# Patient Record
Sex: Female | Born: 1975 | Race: White | Hispanic: No | Marital: Married | Smoking: Never smoker
Health system: Southern US, Community
[De-identification: ages and names within clinical notes are randomized; demographics above are authoritative.]

## PROBLEM LIST (undated history)

## (undated) DIAGNOSIS — A64 Unspecified sexually transmitted disease: Secondary | ICD-10-CM

## (undated) DIAGNOSIS — R002 Palpitations: Secondary | ICD-10-CM

## (undated) DIAGNOSIS — B2790 Infectious mononucleosis, unspecified without complication: Secondary | ICD-10-CM

## (undated) DIAGNOSIS — R748 Abnormal levels of other serum enzymes: Secondary | ICD-10-CM

## (undated) DIAGNOSIS — N946 Dysmenorrhea, unspecified: Secondary | ICD-10-CM

## (undated) DIAGNOSIS — E039 Hypothyroidism, unspecified: Secondary | ICD-10-CM

## (undated) DIAGNOSIS — Q225 Ebstein's anomaly: Secondary | ICD-10-CM

## (undated) DIAGNOSIS — K219 Gastro-esophageal reflux disease without esophagitis: Secondary | ICD-10-CM

## (undated) HISTORY — DX: Palpitations: R00.2

## (undated) HISTORY — DX: Unspecified sexually transmitted disease: A64

## (undated) HISTORY — DX: Dysmenorrhea, unspecified: N94.6

## (undated) HISTORY — DX: Gastro-esophageal reflux disease without esophagitis: K21.9

## (undated) HISTORY — PX: EYE SURGERY: SHX253

## (undated) HISTORY — DX: Hypothyroidism, unspecified: E03.9

---

## 2005-03-05 ENCOUNTER — Other Ambulatory Visit: Admission: RE | Admit: 2005-03-05 | Discharge: 2005-03-05 | Payer: Self-pay | Admitting: Obstetrics and Gynecology

## 2005-09-15 ENCOUNTER — Other Ambulatory Visit: Admission: RE | Admit: 2005-09-15 | Discharge: 2005-09-15 | Payer: Self-pay | Admitting: Obstetrics and Gynecology

## 2008-12-26 ENCOUNTER — Inpatient Hospital Stay (HOSPITAL_COMMUNITY): Admission: AD | Admit: 2008-12-26 | Discharge: 2008-12-29 | Payer: Self-pay | Admitting: Obstetrics and Gynecology

## 2008-12-27 ENCOUNTER — Encounter (INDEPENDENT_AMBULATORY_CARE_PROVIDER_SITE_OTHER): Payer: Self-pay | Admitting: Obstetrics and Gynecology

## 2009-01-01 ENCOUNTER — Ambulatory Visit: Admission: RE | Admit: 2009-01-01 | Discharge: 2009-01-01 | Payer: Self-pay | Admitting: Obstetrics and Gynecology

## 2010-10-26 LAB — CBC
HCT: 44.2 % (ref 36.0–46.0)
MCHC: 35.6 g/dL (ref 30.0–36.0)
MCV: 97.2 fL (ref 78.0–100.0)
Platelets: 280 10*3/uL (ref 150–400)
RDW: 14.5 % (ref 11.5–15.5)
RDW: 14.9 % (ref 11.5–15.5)

## 2010-10-29 ENCOUNTER — Other Ambulatory Visit: Payer: Self-pay | Admitting: Obstetrics and Gynecology

## 2013-05-07 ENCOUNTER — Encounter: Payer: Self-pay | Admitting: Obstetrics and Gynecology

## 2013-05-28 ENCOUNTER — Encounter: Payer: Self-pay | Admitting: Obstetrics and Gynecology

## 2013-09-17 ENCOUNTER — Other Ambulatory Visit: Payer: Self-pay | Admitting: Obstetrics and Gynecology

## 2013-09-17 ENCOUNTER — Ambulatory Visit (INDEPENDENT_AMBULATORY_CARE_PROVIDER_SITE_OTHER): Payer: BC Managed Care – PPO | Admitting: Obstetrics and Gynecology

## 2013-09-17 ENCOUNTER — Encounter: Payer: Self-pay | Admitting: Obstetrics and Gynecology

## 2013-09-17 VITALS — BP 100/70 | HR 64 | Ht 63.0 in | Wt 155.0 lb

## 2013-09-17 DIAGNOSIS — N920 Excessive and frequent menstruation with regular cycle: Secondary | ICD-10-CM

## 2013-09-17 DIAGNOSIS — Z0000 Encounter for general adult medical examination without abnormal findings: Secondary | ICD-10-CM

## 2013-09-17 DIAGNOSIS — Z01419 Encounter for gynecological examination (general) (routine) without abnormal findings: Secondary | ICD-10-CM

## 2013-09-17 DIAGNOSIS — Z1231 Encounter for screening mammogram for malignant neoplasm of breast: Secondary | ICD-10-CM

## 2013-09-17 DIAGNOSIS — R5383 Other fatigue: Secondary | ICD-10-CM

## 2013-09-17 DIAGNOSIS — N946 Dysmenorrhea, unspecified: Secondary | ICD-10-CM

## 2013-09-17 DIAGNOSIS — R5381 Other malaise: Secondary | ICD-10-CM

## 2013-09-17 LAB — COMPREHENSIVE METABOLIC PANEL
ALBUMIN: 4.4 g/dL (ref 3.5–5.2)
ALT: 22 U/L (ref 0–35)
AST: 21 U/L (ref 0–37)
Alkaline Phosphatase: 58 U/L (ref 39–117)
BUN: 11 mg/dL (ref 6–23)
CALCIUM: 9.7 mg/dL (ref 8.4–10.5)
CHLORIDE: 107 meq/L (ref 96–112)
CO2: 25 meq/L (ref 19–32)
CREATININE: 0.78 mg/dL (ref 0.50–1.10)
Glucose, Bld: 85 mg/dL (ref 70–99)
POTASSIUM: 4.2 meq/L (ref 3.5–5.3)
Sodium: 141 mEq/L (ref 135–145)
Total Bilirubin: 0.7 mg/dL (ref 0.2–1.2)
Total Protein: 6.8 g/dL (ref 6.0–8.3)

## 2013-09-17 LAB — CBC
HEMATOCRIT: 40.4 % (ref 36.0–46.0)
HEMOGLOBIN: 14.3 g/dL (ref 12.0–15.0)
MCH: 31.5 pg (ref 26.0–34.0)
MCHC: 35.4 g/dL (ref 30.0–36.0)
MCV: 89 fL (ref 78.0–100.0)
Platelets: 348 10*3/uL (ref 150–400)
RBC: 4.54 MIL/uL (ref 3.87–5.11)
RDW: 13.6 % (ref 11.5–15.5)
WBC: 4.9 10*3/uL (ref 4.0–10.5)

## 2013-09-17 LAB — POCT URINALYSIS DIPSTICK
BILIRUBIN UA: NEGATIVE
Clarity, UA: NEGATIVE
GLUCOSE UA: NEGATIVE
KETONES UA: NEGATIVE
Leukocytes, UA: NEGATIVE
Nitrite, UA: NEGATIVE
PH UA: 5
Protein, UA: NEGATIVE
Urobilinogen, UA: NEGATIVE

## 2013-09-17 LAB — LIPID PANEL
CHOL/HDL RATIO: 3.5 ratio
CHOLESTEROL: 191 mg/dL (ref 0–200)
HDL: 55 mg/dL (ref >39)
LDL Cholesterol: 105 mg/dL — ABNORMAL HIGH (ref 0–99)
Triglycerides: 153 mg/dL — ABNORMAL HIGH (ref <150)
VLDL: 31 mg/dL (ref 0–40)

## 2013-09-17 LAB — HEMOGLOBIN, FINGERSTICK: Hemoglobin, fingerstick: 14.4 g/dL (ref 12.0–16.0)

## 2013-09-17 LAB — TSH: TSH: 2.76 u[IU]/mL (ref 0.350–4.500)

## 2013-09-17 NOTE — Progress Notes (Signed)
Patient scheduled for screening Mammogram at [facility] imaging for 10/08/13 at 1020. Patient agreeable to time/date/location.
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
## 2013-09-17 NOTE — Patient Instructions (Signed)

## 2013-09-18 LAB — IPS PAP TEST WITH HPV

## 2013-09-19 ENCOUNTER — Telehealth: Payer: Self-pay | Admitting: Obstetrics and Gynecology

## 2013-09-19 NOTE — Telephone Encounter (Signed)
° °  Left message for patient to call back. Need to go over insurance quote and schedule PUS.  ° ° °

## 2013-09-28 ENCOUNTER — Telehealth: Payer: Self-pay | Admitting: Obstetrics and Gynecology

## 2013-09-28 NOTE — Telephone Encounter (Signed)
Patient wants to hold off on having PUS performed. She will call back when she is ready.

## 2013-09-28 NOTE — Telephone Encounter (Signed)
Advised patient of PR: $408.26 for PUS.

## 2013-09-28 NOTE — Telephone Encounter (Signed)
OK. I will close encounter.

## 2013-10-08 ENCOUNTER — Ambulatory Visit
Admission: RE | Admit: 2013-10-08 | Discharge: 2013-10-08 | Disposition: A | Discharge status: Home / Self Care | Payer: Self-pay | Source: Ambulatory Visit | Attending: Obstetrics and Gynecology | Admitting: Obstetrics and Gynecology

## 2013-10-08 DIAGNOSIS — Z1231 Encounter for screening mammogram for malignant neoplasm of breast: Secondary | ICD-10-CM

## 2013-10-11 ENCOUNTER — Other Ambulatory Visit: Payer: Self-pay | Admitting: Obstetrics and Gynecology

## 2013-10-11 DIAGNOSIS — R928 Other abnormal and inconclusive findings on diagnostic imaging of breast: Secondary | ICD-10-CM

## 2013-10-22 ENCOUNTER — Ambulatory Visit
Admission: RE | Admit: 2013-10-22 | Discharge: 2013-10-22 | Disposition: A | Discharge status: Home / Self Care | Payer: Self-pay | Source: Ambulatory Visit | Attending: Obstetrics and Gynecology | Admitting: Obstetrics and Gynecology

## 2013-10-22 DIAGNOSIS — R928 Other abnormal and inconclusive findings on diagnostic imaging of breast: Secondary | ICD-10-CM

## 2014-05-09 ENCOUNTER — Telehealth: Payer: Self-pay | Admitting: Obstetrics and Gynecology

## 2014-05-09 NOTE — Telephone Encounter (Signed)
Left message upcoming appointment has been canceled and needs to be rescheduled. °

## 2014-05-20 ENCOUNTER — Encounter: Payer: Self-pay | Admitting: Obstetrics and Gynecology

## 2014-09-20 ENCOUNTER — Ambulatory Visit: Payer: BC Managed Care – PPO | Admitting: Obstetrics and Gynecology

## 2014-10-07 ENCOUNTER — Ambulatory Visit: Payer: Self-pay | Admitting: Obstetrics and Gynecology

## 2015-08-20 ENCOUNTER — Ambulatory Visit (INDEPENDENT_AMBULATORY_CARE_PROVIDER_SITE_OTHER): Payer: BLUE CROSS/BLUE SHIELD | Admitting: Obstetrics and Gynecology

## 2015-08-20 ENCOUNTER — Encounter: Payer: Self-pay | Admitting: Obstetrics and Gynecology

## 2015-08-20 VITALS — BP 104/76 | HR 72 | Resp 16 | Ht 63.0 in | Wt 148.0 lb

## 2015-08-20 DIAGNOSIS — Z0000 Encounter for general adult medical examination without abnormal findings: Secondary | ICD-10-CM

## 2015-08-20 DIAGNOSIS — Z01419 Encounter for gynecological examination (general) (routine) without abnormal findings: Secondary | ICD-10-CM

## 2015-08-20 DIAGNOSIS — Z23 Encounter for immunization: Secondary | ICD-10-CM

## 2015-08-20 LAB — POCT URINALYSIS DIPSTICK
BILIRUBIN UA: NEGATIVE
GLUCOSE UA: NEGATIVE
Ketones, UA: NEGATIVE
Leukocytes, UA: NEGATIVE
Nitrite, UA: NEGATIVE
Protein, UA: NEGATIVE
RBC UA: NEGATIVE
Urobilinogen, UA: NEGATIVE
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
## 2015-08-20 NOTE — Patient Instructions (Signed)

## 2015-08-22 LAB — IPS PAP TEST WITH HPV
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
## 2016-09-08 ENCOUNTER — Other Ambulatory Visit: Payer: Self-pay | Admitting: Obstetrics and Gynecology

## 2016-09-08 ENCOUNTER — Encounter: Payer: Self-pay | Admitting: Obstetrics and Gynecology

## 2016-09-08 ENCOUNTER — Ambulatory Visit (INDEPENDENT_AMBULATORY_CARE_PROVIDER_SITE_OTHER): Payer: BLUE CROSS/BLUE SHIELD | Admitting: Obstetrics and Gynecology

## 2016-09-08 VITALS — BP 110/70 | HR 80 | Resp 14 | Ht 63.25 in | Wt 157.0 lb

## 2016-09-08 DIAGNOSIS — Z01419 Encounter for gynecological examination (general) (routine) without abnormal findings: Secondary | ICD-10-CM | POA: Diagnosis not present

## 2016-09-08 DIAGNOSIS — Z1231 Encounter for screening mammogram for malignant neoplasm of breast: Secondary | ICD-10-CM

## 2016-09-08 MED ORDER — VALACYCLOVIR HCL 500 MG PO TABS: 500.0000 mg | ORAL_TABLET | Freq: Every day | ORAL | 3 refills | Status: DC

## 2016-09-08 NOTE — Patient Instructions (Signed)

## 2016-09-13 ENCOUNTER — Ambulatory Visit
Admission: RE | Admit: 2016-09-13 | Discharge: 2016-09-13 | Disposition: A | Discharge status: Home / Self Care | Payer: BLUE CROSS/BLUE SHIELD | Source: Ambulatory Visit | Attending: Obstetrics and Gynecology | Admitting: Obstetrics and Gynecology

## 2016-09-13 DIAGNOSIS — Z1231 Encounter for screening mammogram for malignant neoplasm of breast: Secondary | ICD-10-CM

## 2016-10-27 ENCOUNTER — Encounter (HOSPITAL_BASED_OUTPATIENT_CLINIC_OR_DEPARTMENT_OTHER): Payer: Self-pay | Admitting: Emergency Medicine

## 2016-10-27 ENCOUNTER — Emergency Department (HOSPITAL_BASED_OUTPATIENT_CLINIC_OR_DEPARTMENT_OTHER)
Admission: EM | Admit: 2016-10-27 | Discharge: 2016-10-27 | Disposition: A | Discharge status: Home / Self Care | Payer: BLUE CROSS/BLUE SHIELD | Attending: Emergency Medicine | Admitting: Emergency Medicine

## 2016-10-27 DIAGNOSIS — E039 Hypothyroidism, unspecified: Secondary | ICD-10-CM | POA: Diagnosis not present

## 2016-10-27 DIAGNOSIS — K2970 Gastritis, unspecified, without bleeding: Secondary | ICD-10-CM | POA: Diagnosis not present

## 2016-10-27 DIAGNOSIS — R1011 Right upper quadrant pain: Secondary | ICD-10-CM | POA: Diagnosis present

## 2016-10-27 HISTORY — DX: Infectious mononucleosis, unspecified without complication: B27.90

## 2016-10-27 LAB — CBC WITH DIFFERENTIAL/PLATELET
BASOS ABS: 0 10*3/uL (ref 0.0–0.1)
BASOS PCT: 0 %
Eosinophils Absolute: 0.1 10*3/uL (ref 0.0–0.7)
Eosinophils Relative: 1 %
HCT: 41.1 % (ref 36.0–46.0)
HEMOGLOBIN: 14.6 g/dL (ref 12.0–15.0)
LYMPHS ABS: 5 10*3/uL — AB (ref 0.7–4.0)
Lymphocytes Relative: 53 %
MCH: 31.8 pg (ref 26.0–34.0)
MCHC: 35.5 g/dL (ref 30.0–36.0)
MCV: 89.5 fL (ref 78.0–100.0)
Monocytes Absolute: 0.9 10*3/uL (ref 0.1–1.0)
Monocytes Relative: 10 %
NEUTROS PCT: 36 %
Neutro Abs: 3.4 10*3/uL (ref 1.7–7.7)
Platelets: 376 10*3/uL (ref 150–400)
RBC: 4.59 MIL/uL (ref 3.87–5.11)
RDW: 12.7 % (ref 11.5–15.5)
WBC: 9.4 10*3/uL (ref 4.0–10.5)

## 2016-10-27 LAB — COMPREHENSIVE METABOLIC PANEL
ALBUMIN: 4.6 g/dL (ref 3.5–5.0)
ALK PHOS: 74 U/L (ref 38–126)
ALT: 41 U/L (ref 14–54)
AST: 39 U/L (ref 15–41)
Anion gap: 10 (ref 5–15)
BUN: 11 mg/dL (ref 6–20)
CHLORIDE: 103 mmol/L (ref 101–111)
CO2: 21 mmol/L — AB (ref 22–32)
CREATININE: 0.8 mg/dL (ref 0.44–1.00)
Calcium: 9.3 mg/dL (ref 8.9–10.3)
GFR calc Af Amer: >60 mL/min (ref >60)
GFR calc non Af Amer: >60 mL/min (ref >60)
GLUCOSE: 110 mg/dL — AB (ref 65–99)
Potassium: 3 mmol/L — ABNORMAL LOW (ref 3.5–5.1)
SODIUM: 134 mmol/L — AB (ref 135–145)
Total Bilirubin: 0.7 mg/dL (ref 0.3–1.2)
Total Protein: 8.2 g/dL — ABNORMAL HIGH (ref 6.5–8.1)

## 2016-10-27 LAB — TROPONIN I

## 2016-10-27 LAB — LIPASE, BLOOD: LIPASE: 35 U/L (ref 11–51)

## 2016-10-27 LAB — PREGNANCY, URINE: Preg Test, Ur: NEGATIVE

## 2016-10-27 MED ORDER — POTASSIUM CHLORIDE CRYS ER 20 MEQ PO TBCR
40.0000 meq | EXTENDED_RELEASE_TABLET | Freq: Once | ORAL | Status: AC
  Administered 2016-10-27: 40 meq via ORAL
  Filled 2016-10-27: qty 2

## 2016-10-27 MED ORDER — ONDANSETRON HCL 4 MG/2ML IJ SOLN
4.0000 mg | Freq: Once | INTRAMUSCULAR | Status: AC
  Administered 2016-10-27: 4 mg via INTRAVENOUS
  Filled 2016-10-27: qty 2

## 2016-10-27 MED ORDER — GI COCKTAIL ~~LOC~~
30.0000 mL | Freq: Once | ORAL | Status: AC
  Administered 2016-10-27: 30 mL via ORAL
  Filled 2016-10-27: qty 30

## 2016-10-27 MED ORDER — PANTOPRAZOLE SODIUM 20 MG PO TBEC: 20.0000 mg | DELAYED_RELEASE_TABLET | Freq: Every day | ORAL | 0 refills | Status: DC

## 2016-10-27 NOTE — ED Triage Notes (Signed)
Pt has been having upper abd pain radiating to back x several days. Pt had [place] this morning looking for gallstones. Pt has not received results from this [place]. Pt given additional warm blankets.

## 2016-10-27 NOTE — Discharge Instructions (Signed)
Return to the ED with any concerns including vomiting and not able to keep down liquids or your medications, abdominal pain especially if it localizes to the right lower abdomen, fever or chills, and decreased urine output, decreased level of alertness or lethargy, or any other alarming symptoms.  °

## 2016-10-27 NOTE — ED Notes (Signed)
ED Provider at bedside. 
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
## 2016-10-27 NOTE — ED Triage Notes (Signed)
Sitting at desk on computer and felt dizzy and felt like she was going to pass out, h/a with nausea. Enroute to ED felt like her heart was fluttering. Tearful, states feels "scared that is going to die"

## 2016-11-09 ENCOUNTER — Encounter: Payer: Self-pay | Admitting: Obstetrics and Gynecology

## 2016-11-09 ENCOUNTER — Ambulatory Visit (INDEPENDENT_AMBULATORY_CARE_PROVIDER_SITE_OTHER): Payer: BLUE CROSS/BLUE SHIELD

## 2016-11-09 ENCOUNTER — Ambulatory Visit (INDEPENDENT_AMBULATORY_CARE_PROVIDER_SITE_OTHER): Payer: BLUE CROSS/BLUE SHIELD | Admitting: Family Medicine

## 2016-11-09 ENCOUNTER — Telehealth: Payer: Self-pay | Admitting: General Practice

## 2016-11-09 ENCOUNTER — Telehealth: Payer: Self-pay | Admitting: Obstetrics and Gynecology

## 2016-11-09 ENCOUNTER — Ambulatory Visit (INDEPENDENT_AMBULATORY_CARE_PROVIDER_SITE_OTHER): Payer: BLUE CROSS/BLUE SHIELD | Admitting: Obstetrics and Gynecology

## 2016-11-09 ENCOUNTER — Telehealth: Payer: Self-pay | Admitting: Family Medicine

## 2016-11-09 ENCOUNTER — Encounter: Payer: Self-pay | Admitting: Family Medicine

## 2016-11-09 VITALS — BP 108/70 | HR 73 | Temp 98.0°F | Ht 63.25 in | Wt 156.2 lb

## 2016-11-09 VITALS — BP 100/60 | HR 64 | Temp 98.9°F | Resp 14 | Wt 155.0 lb

## 2016-11-09 DIAGNOSIS — R1011 Right upper quadrant pain: Secondary | ICD-10-CM

## 2016-11-09 DIAGNOSIS — E038 Other specified hypothyroidism: Secondary | ICD-10-CM | POA: Diagnosis not present

## 2016-11-09 DIAGNOSIS — K5900 Constipation, unspecified: Secondary | ICD-10-CM

## 2016-11-09 DIAGNOSIS — R109 Unspecified abdominal pain: Secondary | ICD-10-CM | POA: Diagnosis not present

## 2016-11-09 DIAGNOSIS — R102 Pelvic and perineal pain: Secondary | ICD-10-CM

## 2016-11-09 DIAGNOSIS — R140 Abdominal distension (gaseous): Secondary | ICD-10-CM | POA: Diagnosis not present

## 2016-11-09 LAB — CBC WITH DIFFERENTIAL/PLATELET
BASOS PCT: 0 %
Basophils Absolute: 0 cells/uL (ref 0–200)
Eosinophils Absolute: 58 cells/uL (ref 15–500)
Eosinophils Relative: 1 %
HEMATOCRIT: 41.2 % (ref 35.0–45.0)
HEMOGLOBIN: 14.1 g/dL (ref 11.7–15.5)
LYMPHS ABS: 2552 {cells}/uL (ref 850–3900)
Lymphocytes Relative: 44 %
MCH: 31.7 pg (ref 27.0–33.0)
MCHC: 34.2 g/dL (ref 32.0–36.0)
MCV: 92.6 fL (ref 80.0–100.0)
MONO ABS: 696 {cells}/uL (ref 200–950)
MPV: 10.3 fL (ref 7.5–12.5)
Monocytes Relative: 12 %
NEUTROS ABS: 2494 {cells}/uL (ref 1500–7800)
Neutrophils Relative %: 43 %
Platelets: 306 10*3/uL (ref 140–400)
RBC: 4.45 MIL/uL (ref 3.80–5.10)
RDW: 13.7 % (ref 11.0–15.0)
WBC: 5.8 10*3/uL (ref 3.8–10.8)

## 2016-11-09 LAB — POCT URINALYSIS DIPSTICK
Bilirubin, UA: NEGATIVE
Blood, UA: NEGATIVE
GLUCOSE UA: NEGATIVE
Ketones, UA: NEGATIVE
Leukocytes, UA: NEGATIVE
NITRITE UA: NEGATIVE
Protein, UA: NEGATIVE
UROBILINOGEN UA: NEGATIVE U/dL — AB
pH, UA: 6 (ref 5.0–8.0)

## 2016-11-09 LAB — POCT URINE PREGNANCY: Preg Test, Ur: NEGATIVE

## 2016-11-09 MED ORDER — PSYLLIUM 0.52 G PO CAPS: 0.5200 g | ORAL_CAPSULE | Freq: Every day | ORAL | 0 refills | Status: DC

## 2016-11-09 MED ORDER — ALIGN PO CAPS: 1.0000 | ORAL_CAPSULE | Freq: Every day | ORAL | 0 refills | Status: DC

## 2016-11-09 MED ORDER — NAPROXEN SODIUM 550 MG PO TABS: 550.0000 mg | ORAL_TABLET | Freq: Two times a day (BID) | ORAL | 2 refills | Status: DC
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
## 2016-11-09 NOTE — Telephone Encounter (Signed)
Note made in error. See previous.

## 2016-11-09 NOTE — Addendum Note (Signed)
Addended by: Lorri Frederick on: 11/09/2016 03:38 PM   Modules accepted: Orders

## 2016-11-09 NOTE — Telephone Encounter (Signed)
Patient is coming in for appointment today.

## 2016-11-09 NOTE — Telephone Encounter (Signed)
Please Advise

## 2016-11-09 NOTE — Telephone Encounter (Signed)
Patient having right sided pelvic pain.

## 2016-11-09 NOTE — Telephone Encounter (Signed)
[facility] called on patients behalf to get visit scheduled. Stated patient was having abdominal pain, no GYN cause. Scheduled for Monday as new patient, but nurse I spoke with stated they would like her in sooner.
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
## 2016-11-09 NOTE — Telephone Encounter (Signed)
Spoke with patient. Patient states almost 2 weeks ago she began having right sided abdominal pain. Was seen at the ER on 10/27/2016. [place] to check for gallstones was negative. Suspected GERD was discharged home with precautions. Was seen by her PCP at [facility] healthcare (notes in Care Everywhere) BMP and H Pylori testing was done which was negative. CT scan performed on 11/01/2016 was normal. Patient has been referred to GI and advised to seek care with GYN for evaluation of ovaries and pelvis. Patient woke up this morning and pain is worsening in RLQ and pelvis. Last took Advil last night. Reports bloating. Denies fever, chills, or bleeding. Appointment scheduled for today 11/09/2016 at 11:30 am with Dr.Jertson as Dr.Silva is out of the office today. Patient is agreeable.  Cc: Dr.Silva  Routing to covering provider for final review. Patient agreeable to disposition. Will close encounter.

## 2016-11-10 DIAGNOSIS — R1011 Right upper quadrant pain: Secondary | ICD-10-CM | POA: Insufficient documentation

## 2016-11-10 DIAGNOSIS — K5900 Constipation, unspecified: Secondary | ICD-10-CM | POA: Insufficient documentation

## 2016-11-10 LAB — THYROID PANEL WITH TSH
FREE THYROXINE INDEX: 2.6 (ref 1.4–3.8)
T3 UPTAKE: 30 % (ref 22–35)
T4, Total: 8.8 ug/dL (ref 4.5–12.0)
TSH: 4.07 mIU/L

## 2016-11-10 LAB — GC/CHLAMYDIA PROBE AMP
CT Probe RNA: NOT DETECTED
GC Probe RNA: NOT DETECTED

## 2016-11-11 ENCOUNTER — Other Ambulatory Visit: Payer: Self-pay | Admitting: Gastroenterology

## 2016-11-11 DIAGNOSIS — R1011 Right upper quadrant pain: Secondary | ICD-10-CM

## 2016-11-11 DIAGNOSIS — R110 Nausea: Secondary | ICD-10-CM

## 2016-11-12 ENCOUNTER — Telehealth: Payer: Self-pay | Admitting: Family Medicine

## 2016-11-12 NOTE — Telephone Encounter (Signed)
Spoke with patient and she has seen GI this week. They are going to do a Hida Scan on her next week. [facility] called that patient, but she did not get the call. Patient is going to call them back to reschedule.

## 2016-11-12 NOTE — Telephone Encounter (Signed)
Patient called in reference to missing her appt. at [facility]. Said she did not receive a phone call or reminder. Patient also stated she saw GI doc yesterday 11/11/16 (Dr. Loreta Ave) at [facility] and they did blood work. Wanted to make sure we got those records over here for her. Please call and advise.

## 2016-11-15 ENCOUNTER — Ambulatory Visit: Payer: BLUE CROSS/BLUE SHIELD | Admitting: Family Medicine

## 2016-11-16 ENCOUNTER — Ambulatory Visit (HOSPITAL_COMMUNITY)
Admission: RE | Admit: 2016-11-16 | Discharge: 2016-11-16 | Disposition: A | Discharge status: Home / Self Care | Payer: BLUE CROSS/BLUE SHIELD | Source: Ambulatory Visit | Attending: Gastroenterology | Admitting: Gastroenterology

## 2016-11-16 DIAGNOSIS — R1011 Right upper quadrant pain: Secondary | ICD-10-CM | POA: Insufficient documentation

## 2016-11-16 DIAGNOSIS — R110 Nausea: Secondary | ICD-10-CM

## 2016-11-16 MED ORDER — TECHNETIUM TC 99M MEBROFENIN IV KIT
5.0000 | PACK | Freq: Once | INTRAVENOUS | Status: AC | PRN
  Administered 2016-11-16: 5 via INTRAVENOUS

## 2016-11-17 ENCOUNTER — Telehealth: Payer: Self-pay | Admitting: Obstetrics and Gynecology

## 2016-11-17 NOTE — Telephone Encounter (Signed)
Phone call in follow up the patient's visit here and abdominal pain.  I left a message that I was calling to check up on her and see if she needed anything further.

## 2016-11-25 ENCOUNTER — Telehealth: Payer: Self-pay | Admitting: Family Medicine

## 2016-11-25 NOTE — Telephone Encounter (Signed)
Faxing ROI to Dr.Suzann Hedgecock @ [phone] P[place]

## 2016-12-23 ENCOUNTER — Telehealth: Payer: Self-pay | Admitting: Family Medicine

## 2016-12-23 NOTE — Telephone Encounter (Signed)
Re-fax ROI to [facility]

## 2017-06-13 NOTE — Telephone Encounter (Signed)
No [facility] from [facility]

## 2017-07-19 DIAGNOSIS — K219 Gastro-esophageal reflux disease without esophagitis: Secondary | ICD-10-CM

## 2017-07-19 DIAGNOSIS — R002 Palpitations: Secondary | ICD-10-CM

## 2017-07-19 HISTORY — DX: Gastro-esophageal reflux disease without esophagitis: K21.9

## 2017-07-19 HISTORY — DX: Palpitations: R00.2

## 2017-09-21 ENCOUNTER — Ambulatory Visit: Payer: BLUE CROSS/BLUE SHIELD | Admitting: Obstetrics and Gynecology

## 2017-09-21 ENCOUNTER — Emergency Department (HOSPITAL_BASED_OUTPATIENT_CLINIC_OR_DEPARTMENT_OTHER)
Admission: EM | Admit: 2017-09-21 | Discharge: 2017-09-21 | Disposition: A | Discharge status: Home / Self Care | Payer: BLUE CROSS/BLUE SHIELD | Attending: Emergency Medicine | Admitting: Emergency Medicine

## 2017-09-21 ENCOUNTER — Other Ambulatory Visit: Payer: Self-pay

## 2017-09-21 ENCOUNTER — Encounter (HOSPITAL_BASED_OUTPATIENT_CLINIC_OR_DEPARTMENT_OTHER): Payer: Self-pay | Admitting: *Deleted

## 2017-09-21 DIAGNOSIS — R110 Nausea: Secondary | ICD-10-CM | POA: Diagnosis not present

## 2017-09-21 DIAGNOSIS — R0981 Nasal congestion: Secondary | ICD-10-CM | POA: Insufficient documentation

## 2017-09-21 DIAGNOSIS — Z79899 Other long term (current) drug therapy: Secondary | ICD-10-CM | POA: Diagnosis not present

## 2017-09-21 DIAGNOSIS — R531 Weakness: Secondary | ICD-10-CM | POA: Diagnosis not present

## 2017-09-21 DIAGNOSIS — R1010 Upper abdominal pain, unspecified: Secondary | ICD-10-CM | POA: Insufficient documentation

## 2017-09-21 DIAGNOSIS — R51 Headache: Secondary | ICD-10-CM | POA: Insufficient documentation

## 2017-09-21 DIAGNOSIS — E039 Hypothyroidism, unspecified: Secondary | ICD-10-CM | POA: Diagnosis not present

## 2017-09-21 HISTORY — DX: Ebstein's anomaly: Q22.5

## 2017-09-21 LAB — COMPREHENSIVE METABOLIC PANEL
ALT: 32 U/L (ref 14–54)
ANION GAP: 8 (ref 5–15)
AST: 33 U/L (ref 15–41)
Albumin: 4.1 g/dL (ref 3.5–5.0)
Alkaline Phosphatase: 64 U/L (ref 38–126)
BILIRUBIN TOTAL: 0.5 mg/dL (ref 0.3–1.2)
BUN: 7 mg/dL (ref 6–20)
CALCIUM: 8.8 mg/dL — AB (ref 8.9–10.3)
CO2: 23 mmol/L (ref 22–32)
Chloride: 105 mmol/L (ref 101–111)
Creatinine, Ser: 0.72 mg/dL (ref 0.44–1.00)
GFR calc non Af Amer: >60 mL/min (ref >60)
Glucose, Bld: 149 mg/dL — ABNORMAL HIGH (ref 65–99)
POTASSIUM: 3.8 mmol/L (ref 3.5–5.1)
Sodium: 136 mmol/L (ref 135–145)
TOTAL PROTEIN: 7.3 g/dL (ref 6.5–8.1)

## 2017-09-21 LAB — CBC WITH DIFFERENTIAL/PLATELET
BASOS ABS: 0 10*3/uL (ref 0.0–0.1)
BASOS PCT: 0 %
Eosinophils Absolute: 0 10*3/uL (ref 0.0–0.7)
Eosinophils Relative: 0 %
HCT: 41.2 % (ref 36.0–46.0)
HEMOGLOBIN: 14.7 g/dL (ref 12.0–15.0)
Lymphocytes Relative: 20 %
Lymphs Abs: 1.3 10*3/uL (ref 0.7–4.0)
MCH: 32 pg (ref 26.0–34.0)
MCHC: 35.7 g/dL (ref 30.0–36.0)
MCV: 89.6 fL (ref 78.0–100.0)
Monocytes Absolute: 0.5 10*3/uL (ref 0.1–1.0)
Monocytes Relative: 8 %
NEUTROS ABS: 4.9 10*3/uL (ref 1.7–7.7)
NEUTROS PCT: 72 %
Platelets: 330 10*3/uL (ref 150–400)
RBC: 4.6 MIL/uL (ref 3.87–5.11)
RDW: 12.6 % (ref 11.5–15.5)
WBC: 6.7 10*3/uL (ref 4.0–10.5)

## 2017-09-21 LAB — URINALYSIS, ROUTINE W REFLEX MICROSCOPIC
BILIRUBIN URINE: NEGATIVE
Glucose, UA: NEGATIVE mg/dL
HGB URINE DIPSTICK: NEGATIVE
KETONES UR: NEGATIVE mg/dL
Leukocytes, UA: NEGATIVE
NITRITE: NEGATIVE
Protein, ur: NEGATIVE mg/dL
SPECIFIC GRAVITY, URINE: 1.01 (ref 1.005–1.030)
pH: 8 (ref 5.0–8.0)

## 2017-09-21 LAB — CBG MONITORING, ED: GLUCOSE-CAPILLARY: 122 mg/dL — AB (ref 65–99)

## 2017-09-21 LAB — INFLUENZA PANEL BY PCR (TYPE A & B)
INFLBPCR: NEGATIVE
Influenza A By PCR: NEGATIVE

## 2017-09-21 MED ORDER — SODIUM CHLORIDE 0.9 % IV BOLUS (SEPSIS)
1000.0000 mL | Freq: Once | INTRAVENOUS | Status: AC
  Administered 2017-09-21: 1000 mL via INTRAVENOUS

## 2017-09-21 NOTE — ED Triage Notes (Signed)
Pt has EBD. Pt has been very weak and tired. Unable to eat and very thirsty. Pt is nauseated, and having tightness in abdomen.
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
## 2017-09-21 NOTE — Discharge Instructions (Signed)
It was my pleasure taking care of you today!   Fortunately, your lab work was reassuring today. Your flu test is still pending. I will call you if it comes back positive. If you do not hear from me, this was negative.   Please keep your scheduled appointment with your primary care doctor tomorrow.  Increase hydration.  Return to emergency department for new or worsening symptoms, any additional concerns.

## 2017-09-21 NOTE — ED Notes (Signed)
NAD at this time. Pt is stable and going home.  

## 2017-09-25 ENCOUNTER — Other Ambulatory Visit: Payer: Self-pay

## 2017-09-25 ENCOUNTER — Encounter (HOSPITAL_BASED_OUTPATIENT_CLINIC_OR_DEPARTMENT_OTHER): Payer: Self-pay | Admitting: *Deleted

## 2017-09-25 ENCOUNTER — Emergency Department (HOSPITAL_BASED_OUTPATIENT_CLINIC_OR_DEPARTMENT_OTHER)
Admission: EM | Admit: 2017-09-25 | Discharge: 2017-09-26 | Disposition: A | Discharge status: Home / Self Care | Payer: BLUE CROSS/BLUE SHIELD | Attending: Emergency Medicine | Admitting: Emergency Medicine

## 2017-09-25 ENCOUNTER — Emergency Department (HOSPITAL_BASED_OUTPATIENT_CLINIC_OR_DEPARTMENT_OTHER): Payer: BLUE CROSS/BLUE SHIELD

## 2017-09-25 DIAGNOSIS — Z79899 Other long term (current) drug therapy: Secondary | ICD-10-CM | POA: Diagnosis not present

## 2017-09-25 DIAGNOSIS — E039 Hypothyroidism, unspecified: Secondary | ICD-10-CM | POA: Insufficient documentation

## 2017-09-25 DIAGNOSIS — R0789 Other chest pain: Secondary | ICD-10-CM | POA: Diagnosis not present

## 2017-09-25 LAB — CBC
HCT: 41.3 % (ref 36.0–46.0)
HEMOGLOBIN: 14.9 g/dL (ref 12.0–15.0)
MCH: 32.5 pg (ref 26.0–34.0)
MCHC: 36.1 g/dL — AB (ref 30.0–36.0)
MCV: 90 fL (ref 78.0–100.0)
Platelets: 361 10*3/uL (ref 150–400)
RBC: 4.59 MIL/uL (ref 3.87–5.11)
RDW: 12.4 % (ref 11.5–15.5)
WBC: 8.8 10*3/uL (ref 4.0–10.5)

## 2017-09-25 LAB — BASIC METABOLIC PANEL
ANION GAP: 12 (ref 5–15)
BUN: 9 mg/dL (ref 6–20)
CALCIUM: 9.7 mg/dL (ref 8.9–10.3)
CHLORIDE: 104 mmol/L (ref 101–111)
CO2: 20 mmol/L — AB (ref 22–32)
CREATININE: 1.03 mg/dL — AB (ref 0.44–1.00)
GFR calc non Af Amer: >60 mL/min (ref >60)
GLUCOSE: 99 mg/dL (ref 65–99)
Potassium: 3.4 mmol/L — ABNORMAL LOW (ref 3.5–5.1)
Sodium: 136 mmol/L (ref 135–145)

## 2017-09-25 LAB — TROPONIN I: Troponin I: <0.03 ng/mL (ref <0.03)

## 2017-09-25 NOTE — ED Triage Notes (Signed)
Pt states she was seen here Wed for nausea and weakness. Seen at [facility] yesterday and EKG, CXR were normal. Spoke with PCP today who thinks this may be r/t thyroid issue. Pt states she had had a good day, but was sitting at home and developed left side CP, heaviness in back. Hands sweating. Feet and hands feel cold. Also feels thirsty and like there is a "ball in her throat."

## 2017-09-25 NOTE — ED Notes (Signed)
Pt states her CP has eased.

## 2017-09-26 LAB — PREGNANCY, URINE: Preg Test, Ur: NEGATIVE

## 2017-09-26 MED ORDER — SODIUM CHLORIDE 0.9 % IV BOLUS (SEPSIS): 1000.0000 mL | Freq: Once | INTRAVENOUS | Status: DC

## 2017-09-26 NOTE — ED Notes (Signed)
Pt given cup to attempt urine sample. 

## 2017-09-26 NOTE — ED Notes (Signed)
Pt notified RN that she does not want IV fluids, and feels ready to go home. EDP notified.

## 2017-09-26 NOTE — Discharge Instructions (Signed)
You were seen today for chest pain.  Your workup is reassuring including EKG.  Make sure to stay well-hydrated.  Follow-up with your primary physician regarding her thyroid studies.
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
## 2017-09-28 ENCOUNTER — Emergency Department (HOSPITAL_BASED_OUTPATIENT_CLINIC_OR_DEPARTMENT_OTHER)
Admission: EM | Admit: 2017-09-28 | Discharge: 2017-09-28 | Disposition: A | Discharge status: Home / Self Care | Payer: BLUE CROSS/BLUE SHIELD | Attending: Emergency Medicine | Admitting: Emergency Medicine

## 2017-09-28 ENCOUNTER — Other Ambulatory Visit: Payer: Self-pay

## 2017-09-28 ENCOUNTER — Encounter (HOSPITAL_BASED_OUTPATIENT_CLINIC_OR_DEPARTMENT_OTHER): Payer: Self-pay | Admitting: Emergency Medicine

## 2017-09-28 DIAGNOSIS — Z5321 Procedure and treatment not carried out due to patient leaving prior to being seen by health care provider: Secondary | ICD-10-CM | POA: Insufficient documentation

## 2017-09-28 DIAGNOSIS — G250 Essential tremor: Secondary | ICD-10-CM | POA: Diagnosis present

## 2017-09-28 NOTE — ED Notes (Signed)
Pt informed registration she was leaving  

## 2017-09-28 NOTE — ED Triage Notes (Signed)
Pt appears anxious in triage. Pt had US of thyroid done yesterday through [facility].

## 2017-09-28 NOTE — ED Triage Notes (Signed)
Pt states she awoke with shaking all over and unable to control her muscles.
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
## 2017-11-15 ENCOUNTER — Other Ambulatory Visit (HOSPITAL_COMMUNITY)
Admission: RE | Admit: 2017-11-15 | Discharge: 2017-11-15 | Disposition: A | Discharge status: Home / Self Care | Payer: BLUE CROSS/BLUE SHIELD | Source: Ambulatory Visit | Attending: Obstetrics and Gynecology | Admitting: Obstetrics and Gynecology

## 2017-11-15 ENCOUNTER — Other Ambulatory Visit: Payer: Self-pay

## 2017-11-15 ENCOUNTER — Ambulatory Visit (INDEPENDENT_AMBULATORY_CARE_PROVIDER_SITE_OTHER): Payer: BLUE CROSS/BLUE SHIELD | Admitting: Obstetrics and Gynecology

## 2017-11-15 ENCOUNTER — Encounter: Payer: Self-pay | Admitting: Obstetrics and Gynecology

## 2017-11-15 VITALS — BP 122/68 | HR 66 | Resp 14 | Ht 63.0 in | Wt 147.4 lb

## 2017-11-15 DIAGNOSIS — Z01419 Encounter for gynecological examination (general) (routine) without abnormal findings: Secondary | ICD-10-CM

## 2017-11-15 DIAGNOSIS — F419 Anxiety disorder, unspecified: Secondary | ICD-10-CM | POA: Diagnosis not present

## 2017-11-15 DIAGNOSIS — F32A Depression, unspecified: Secondary | ICD-10-CM

## 2017-11-15 DIAGNOSIS — F329 Major depressive disorder, single episode, unspecified: Secondary | ICD-10-CM

## 2017-11-15 MED ORDER — SERTRALINE HCL 50 MG PO TABS: 50.0000 mg | ORAL_TABLET | Freq: Every day | ORAL | 2 refills | Status: AC

## 2017-11-15 NOTE — Patient Instructions (Signed)
EXERCISE AND DIET:  We recommended that you start or continue a regular exercise program for good health. Regular exercise means any activity that makes your heart beat faster and makes you sweat.  We recommend exercising at least 30 minutes per day at least 3 days a week, preferably 4 or 5.  We also recommend a diet low in fat and sugar.  Inactivity, poor dietary choices and obesity can cause diabetes, heart attack, stroke, and kidney damage, among others.    ALCOHOL AND SMOKING:  Women should limit their alcohol intake to no more than 7 drinks/beers/glasses of wine (combined, not each!) per week. Moderation of alcohol intake to this level decreases your risk of breast cancer and liver damage. And of course, no recreational drugs are part of a healthy lifestyle.  And absolutely no smoking or even second hand smoke. Most people know smoking can cause heart and lung diseases, but did you know it also contributes to weakening of your bones? Aging of your skin?  Yellowing of your teeth and nails?  CALCIUM AND VITAMIN D:  Adequate intake of calcium and Vitamin D are recommended.  The recommendations for exact amounts of these supplements seem to change often, but generally speaking 600 mg of calcium (either carbonate or citrate) and 800 units of Vitamin D per day seems prudent. Certain women may benefit from higher intake of Vitamin D.  If you are among these women, your doctor will have told you during your visit.    PAP SMEARS:  Pap smears, to check for cervical cancer or precancers,  have traditionally been done yearly, although recent scientific advances have shown that most women can have pap smears less often.  However, every woman still should have a physical exam from her gynecologist every year. It will include a breast check, inspection of the vulva and vagina to check for abnormal growths or skin changes, a visual exam of the cervix, and then an exam to evaluate the size and shape of the uterus and  ovaries.  And after 44 years of age, a rectal exam is indicated to check for rectal cancers. We will also provide age appropriate advice regarding health maintenance, like when you should have certain vaccines, screening for sexually transmitted diseases, bone density testing, colonoscopy, mammograms, etc.   MAMMOGRAMS:  All women over 40 years old should have a yearly mammogram. Many facilities now offer a "3D" mammogram, which may cost around $50 extra out of pocket. If possible,  we recommend you accept the option to have the 3D mammogram performed.  It both reduces the number of women who will be called back for extra views which then turn out to be normal, and it is better than the routine mammogram at detecting truly abnormal areas.    COLONOSCOPY:  Colonoscopy to screen for colon cancer is recommended for all women at age 50.  We know, you hate the idea of the prep.  We agree, BUT, having colon cancer and not knowing it is worse!!  Colon cancer so often starts as a polyp that can be seen and removed at colonscopy, which can quite literally save your life!  And if your first colonoscopy is normal and you have no family history of colon cancer, most women don't have to have it again for 10 years.  Once every ten years, you can do something that may end up saving your life, right?  We will be happy to help you get it scheduled when you are ready.    Be sure to check your insurance coverage so you understand how much it will cost.  It may be covered as a preventative service at no cost, but you should check your particular policy.     Sertraline tablets What is this medicine? SERTRALINE (SER tra leen) is used to treat depression. It may also be used to treat obsessive compulsive disorder, panic disorder, post-trauma stress, premenstrual dysphoric disorder (PMDD) or social anxiety. This medicine may be used for other purposes; ask your health care provider or pharmacist if you have questions. COMMON BRAND  NAME(S): Zoloft What should I tell my health care provider before I take this medicine? They need to know if you have any of these conditions: -bleeding disorders -bipolar disorder or a family history of bipolar disorder -glaucoma -heart disease -high blood pressure -history of irregular heartbeat -history of low levels of calcium, magnesium, or potassium in the blood -if you often drink alcohol -liver disease -receiving electroconvulsive therapy -seizures -suicidal thoughts, plans, or attempt; a previous suicide attempt by you or a family member -take medicines that treat or prevent blood clots -thyroid disease -an unusual or allergic reaction to sertraline, other medicines, foods, dyes, or preservatives -pregnant or trying to get pregnant -breast-feeding How should I use this medicine? Take this medicine by mouth with a glass of water. Follow the directions on the prescription label. You can take it with or without food. Take your medicine at regular intervals. Do not take your medicine more often than directed. Do not stop taking this medicine suddenly except upon the advice of your doctor. Stopping this medicine too quickly may cause serious side effects or your condition may worsen. A special MedGuide will be given to you by the pharmacist with each prescription and refill. Be sure to read this information carefully each time. Talk to your pediatrician regarding the use of this medicine in children. While this drug may be prescribed for children as young as 7 years for selected conditions, precautions do apply. Overdosage: If you think you have taken too much of this medicine contact a poison control center or emergency room at once. NOTE: This medicine is only for you. Do not share this medicine with others. What if I miss a dose? If you miss a dose, take it as soon as you can. If it is almost time for your next dose, take only that dose. Do not take double or extra doses. What may  interact with this medicine? Do not take this medicine with any of the following medications: -cisapride -dofetilide -dronedarone -linezolid -MAOIs like Carbex, Eldepryl, Marplan, Nardil, and Parnate -methylene blue (injected into a vein) -pimozide -thioridazine This medicine may also interact with the following medications: -alcohol -amphetamines -aspirin and aspirin-like medicines -certain medicines for depression, anxiety, or psychotic disturbances -certain medicines for fungal infections like ketoconazole, fluconazole, posaconazole, and itraconazole -certain medicines for irregular heart beat like flecainide, quinidine, propafenone -certain medicines for migraine headaches like almotriptan, eletriptan, frovatriptan, naratriptan, rizatriptan, sumatriptan, zolmitriptan -certain medicines for sleep -certain medicines for seizures like carbamazepine, valproic acid, phenytoin -certain medicines that treat or prevent blood clots like warfarin, enoxaparin, dalteparin -cimetidine -digoxin -diuretics -fentanyl -isoniazid -lithium -NSAIDs, medicines for pain and inflammation, like ibuprofen or naproxen -other medicines that prolong the QT interval (cause an abnormal heart rhythm) -rasagiline -safinamide -supplements like St. John's wort, kava kava, valerian -tolbutamide -tramadol -tryptophan This list may not describe all possible interactions. Give your health care provider a list of all the medicines, herbs, non-prescription drugs, or   dietary supplements you use. Also tell them if you smoke, drink alcohol, or use illegal drugs. Some items may interact with your medicine. What should I watch for while using this medicine? Tell your doctor if your symptoms do not get better or if they get worse. Visit your doctor or health care professional for regular checks on your progress. Because it may take several weeks to see the full effects of this medicine, it is important to continue your  treatment as prescribed by your doctor. Patients and their families should watch out for new or worsening thoughts of suicide or depression. Also watch out for sudden changes in feelings such as feeling anxious, agitated, panicky, irritable, hostile, aggressive, impulsive, severely restless, overly excited and hyperactive, or not being able to sleep. If this happens, especially at the beginning of treatment or after a change in dose, call your health care professional. You may get drowsy or dizzy. Do not drive, use machinery, or do anything that needs mental alertness until you know how this medicine affects you. Do not stand or sit up quickly, especially if you are an older patient. This reduces the risk of dizzy or fainting spells. Alcohol may interfere with the effect of this medicine. Avoid alcoholic drinks. Your mouth may get dry. Chewing sugarless gum or sucking hard candy, and drinking plenty of water may help. Contact your doctor if the problem does not go away or is severe. What side effects may I notice from receiving this medicine? Side effects that you should report to your doctor or health care professional as soon as possible: -allergic reactions like skin rash, itching or hives, swelling of the face, lips, or tongue -anxious -black, tarry stools -changes in vision -confusion -elevated mood, decreased need for sleep, racing thoughts, impulsive behavior -eye pain -fast, irregular heartbeat -feeling faint or lightheaded, falls -feeling agitated, angry, or irritable -hallucination, loss of contact with reality -loss of balance or coordination -loss of memory -painful or prolonged erections -restlessness, pacing, inability to keep still -seizures -stiff muscles -suicidal thoughts or other mood changes -trouble sleeping -unusual bleeding or bruising -unusually weak or tired -vomiting Side effects that usually do not require medical attention (report to your doctor or health care  professional if they continue or are bothersome): -change in appetite or weight -change in sex drive or performance -diarrhea -increased sweating -indigestion, nausea -tremors This list may not describe all possible side effects. Call your doctor for medical advice about side effects. You may report side effects to FDA at 1-800-FDA-1088. Where should I keep my medicine? Keep out of the reach of children. Store at room temperature between 15 and 30 degrees C (59 and 86 degrees F). Throw away any unused medicine after the expiration date. NOTE: This sheet is a summary. It may not cover all possible information. If you have questions about this medicine, talk to your doctor, pharmacist, or health care provider.  2018 Elsevier/Gold Standard (2016-07-09 14:17:49)  

## 2017-11-18 ENCOUNTER — Other Ambulatory Visit: Payer: Self-pay

## 2017-11-18 ENCOUNTER — Encounter (HOSPITAL_BASED_OUTPATIENT_CLINIC_OR_DEPARTMENT_OTHER): Payer: Self-pay | Admitting: *Deleted

## 2017-11-18 ENCOUNTER — Emergency Department (HOSPITAL_BASED_OUTPATIENT_CLINIC_OR_DEPARTMENT_OTHER)
Admission: EM | Admit: 2017-11-18 | Discharge: 2017-11-18 | Disposition: A | Discharge status: Home / Self Care | Payer: BLUE CROSS/BLUE SHIELD | Attending: Emergency Medicine | Admitting: Emergency Medicine

## 2017-11-18 ENCOUNTER — Emergency Department (HOSPITAL_BASED_OUTPATIENT_CLINIC_OR_DEPARTMENT_OTHER): Payer: BLUE CROSS/BLUE SHIELD

## 2017-11-18 DIAGNOSIS — R002 Palpitations: Secondary | ICD-10-CM | POA: Diagnosis not present

## 2017-11-18 DIAGNOSIS — Z79899 Other long term (current) drug therapy: Secondary | ICD-10-CM | POA: Insufficient documentation

## 2017-11-18 DIAGNOSIS — R0789 Other chest pain: Secondary | ICD-10-CM | POA: Diagnosis not present

## 2017-11-18 DIAGNOSIS — E039 Hypothyroidism, unspecified: Secondary | ICD-10-CM | POA: Diagnosis not present

## 2017-11-18 LAB — CYTOLOGY - PAP
Diagnosis: NEGATIVE
HPV: NOT DETECTED

## 2017-11-18 LAB — BASIC METABOLIC PANEL
ANION GAP: 11 (ref 5–15)
BUN: 14 mg/dL (ref 6–20)
CO2: 21 mmol/L — AB (ref 22–32)
Calcium: 9.3 mg/dL (ref 8.9–10.3)
Chloride: 104 mmol/L (ref 101–111)
Creatinine, Ser: 0.7 mg/dL (ref 0.44–1.00)
GFR calc Af Amer: >60 mL/min (ref >60)
Glucose, Bld: 111 mg/dL — ABNORMAL HIGH (ref 65–99)
POTASSIUM: 3.4 mmol/L — AB (ref 3.5–5.1)
Sodium: 136 mmol/L (ref 135–145)

## 2017-11-18 LAB — CBC
HEMATOCRIT: 41.9 % (ref 36.0–46.0)
Hemoglobin: 14.9 g/dL (ref 12.0–15.0)
MCH: 32.8 pg (ref 26.0–34.0)
MCHC: 35.6 g/dL (ref 30.0–36.0)
MCV: 92.3 fL (ref 78.0–100.0)
Platelets: 342 10*3/uL (ref 150–400)
RBC: 4.54 MIL/uL (ref 3.87–5.11)
RDW: 12.8 % (ref 11.5–15.5)
WBC: 9.6 10*3/uL (ref 4.0–10.5)

## 2017-11-18 LAB — PREGNANCY, URINE: PREG TEST UR: NEGATIVE

## 2017-11-18 LAB — TROPONIN I

## 2017-11-18 NOTE — ED Notes (Signed)
ED Provider at bedside. 

## 2017-11-18 NOTE — ED Provider Notes (Signed)
ED ECG REPORT   Date: 11/18/2017  Rate: 77  Rhythm: normal sinus rhythm  QRS Axis: normal  Intervals: normal  ST/T Wave abnormalities: normal  Conduction Disutrbances:none  Narrative Interpretation:   Old EKG Reviewed: none available  I have personally reviewed the EKG tracing and agree with the computerized printout as noted.  EKG did not crossover from [facility], so entered manually here.  No acute findings on EKG.  Medical screening examination/treatment/procedure(s) were performed by non-physician practitioner and as supervising physician I was immediately available for consultation/collaboration.  None    Vanetta Mulders, MD 11/18/17 [phone]

## 2017-11-18 NOTE — ED Notes (Signed)
Pt reports an aching pressure to axilla that travels down L arm. Pain is reproducible to palpation. Pt reports aggravating factors to be when she experiences a PVC. Pt currently being treated by cardiologist for this complaint. Pt frustrated with cardiologist because she is not getting answers.

## 2017-11-18 NOTE — Discharge Instructions (Signed)
You can take Tylenol or Ibuprofen as directed for pain. You can alternate Tylenol and Ibuprofen every 4 hours. If you take Tylenol at 1pm, then you can take Ibuprofen at 5pm. Then you can take Tylenol again at 9pm.   As we discussed, follow-up with your primary care doctor regarding your thyroid studies.  Follow-up with referred cardiologist for further evaluation.  Return to the Emergency Department immediately if you experiencing worsening chest pain, difficulty breathing, nausea/vomiting, get very sweaty, headache or any other worsening or concerning symptoms.

## 2017-11-18 NOTE — ED Triage Notes (Signed)
Pain in her left chest and arm. She was seen by a cardiologist yesterday for irregular heart beat. She had an [place] of her neck. Hx of anxiety.
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
## 2017-11-18 NOTE — ED Notes (Signed)
ED PA informed of refusing CXR, will cancel order
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
## 2017-12-23 ENCOUNTER — Telehealth: Payer: Self-pay | Admitting: Obstetrics and Gynecology

## 2017-12-23 NOTE — Telephone Encounter (Signed)
Patient called and cancelled her appointment on 12/28/17 with Dr. Edward JollySilva for a six week recheck. She reported she is doing well but getting ready to go out of the country until the end of July. She said she will call back to reschedule once she confirms her return date.  Routing to provider for FYI. Okay to close encounter or is further follow up needed?

## 2017-12-23 NOTE — Telephone Encounter (Signed)
Please contact patient in follow up to see if she is taking the Zoloft medication.   Cc- Starla Curl

## 2017-12-23 NOTE — Telephone Encounter (Signed)
Call to patient. Per DPR, can leave message on voice mail, which has name confirmation.  Left message calling to follow up on canceled follow-up and to see how she is doing on medication. Left message to call back to triage nurse.

## 2017-12-28 ENCOUNTER — Ambulatory Visit: Payer: BLUE CROSS/BLUE SHIELD | Admitting: Obstetrics and Gynecology

## 2017-12-30 NOTE — Telephone Encounter (Signed)
Patient has not started rx for Zoloft. Is working with a therapist and does not wish to take Zoloft at this time.  Routing to provider for final review. Patient agreeable to disposition. Will close encounter.

## 2018-02-20 IMAGING — NM NM HEPATO W/GB/PHARM/[PERSON_NAME]
2 series · 12 of 12 positions shown · non-contrast
Comparison: None.

CLINICAL DATA: Right upper quadrant pain for 1 month

EXAM:
NUCLEAR MEDICINE HEPATOBILIARY IMAGING WITH GALLBLADDER EF
TECHNIQUE: Sequential images of the abdomen were obtained [DATE] minutes
following intravenous administration of radiopharmaceutical. After
oral ingestion of Ensure, gallbladder ejection fraction was
determined. At 60 min, normal ejection fraction is greater than 33%.
RADIOPHARMACEUTICALS:  5.3 mCi Xc-XXm  Choletec IV

[he hepatobiliary · 3.43mm/px · 6 of 59 frames shown (1 of 2)]
[frame 5/59]
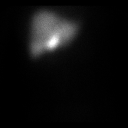
[frame 15/59]
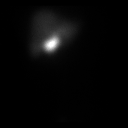
[frame 25/59]
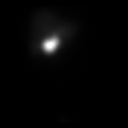
[frame 35/59]
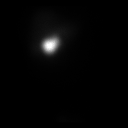
[frame 45/59]
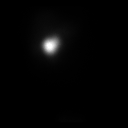
[frame 55/59]
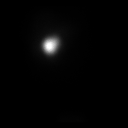

[he hepatobiliary · 3.43mm/px · 6 of 60 frames shown (2 of 2)]
[frame 6/60]
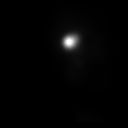
[frame 16/60]
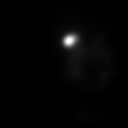
[frame 26/60]
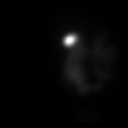
[frame 36/60]
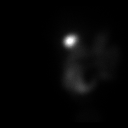
[frame 46/60]
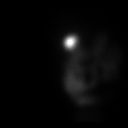
[frame 56/60]
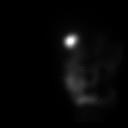

[12 of 12 positions shown; findings below may reference images not displayed]

FINDINGS: Prompt uptake and biliary excretion of activity by the liver is
seen. Gallbladder activity is visualized, consistent with patency of
cystic duct. Biliary activity passes into small bowel, consistent
with patent common bile duct.

Calculated gallbladder ejection fraction is 60%. (Normal gallbladder
ejection fraction with Ensure is greater than 33%.) Fatty meal
reproduced patient's symptomatology.
IMPRESSION: Normal uptake in excretion of biliary tracer.

Normal gallbladder ejection fraction.

Reproduction of patient's symptomatology with fatty meal.

## 2018-11-20 ENCOUNTER — Ambulatory Visit: Payer: BLUE CROSS/BLUE SHIELD | Admitting: Obstetrics and Gynecology

## 2020-04-21 ENCOUNTER — Other Ambulatory Visit: Payer: Self-pay | Admitting: Family Medicine

## 2020-04-21 DIAGNOSIS — E041 Nontoxic single thyroid nodule: Secondary | ICD-10-CM

## 2020-04-24 ENCOUNTER — Ambulatory Visit (INDEPENDENT_AMBULATORY_CARE_PROVIDER_SITE_OTHER): Payer: BLUE CROSS/BLUE SHIELD

## 2020-04-24 ENCOUNTER — Other Ambulatory Visit: Payer: Self-pay

## 2020-04-24 DIAGNOSIS — E041 Nontoxic single thyroid nodule: Secondary | ICD-10-CM

## 2020-09-24 ENCOUNTER — Other Ambulatory Visit: Payer: Self-pay | Admitting: Family Medicine

## 2020-09-24 DIAGNOSIS — K760 Fatty (change of) liver, not elsewhere classified: Secondary | ICD-10-CM

## 2020-09-25 ENCOUNTER — Emergency Department (HOSPITAL_BASED_OUTPATIENT_CLINIC_OR_DEPARTMENT_OTHER): Payer: Self-pay

## 2020-09-25 ENCOUNTER — Encounter (HOSPITAL_BASED_OUTPATIENT_CLINIC_OR_DEPARTMENT_OTHER): Payer: Self-pay

## 2020-09-25 ENCOUNTER — Emergency Department (HOSPITAL_BASED_OUTPATIENT_CLINIC_OR_DEPARTMENT_OTHER)
Admission: EM | Admit: 2020-09-25 | Discharge: 2020-09-25 | Disposition: A | Discharge status: Home / Self Care | Payer: Self-pay | Attending: Emergency Medicine | Admitting: Emergency Medicine

## 2020-09-25 ENCOUNTER — Other Ambulatory Visit: Payer: Self-pay

## 2020-09-25 DIAGNOSIS — R824 Acetonuria: Secondary | ICD-10-CM | POA: Insufficient documentation

## 2020-09-25 DIAGNOSIS — Z8719 Personal history of other diseases of the digestive system: Secondary | ICD-10-CM | POA: Insufficient documentation

## 2020-09-25 DIAGNOSIS — E039 Hypothyroidism, unspecified: Secondary | ICD-10-CM | POA: Insufficient documentation

## 2020-09-25 DIAGNOSIS — R1011 Right upper quadrant pain: Secondary | ICD-10-CM

## 2020-09-25 DIAGNOSIS — R5383 Other fatigue: Secondary | ICD-10-CM | POA: Insufficient documentation

## 2020-09-25 DIAGNOSIS — R110 Nausea: Secondary | ICD-10-CM | POA: Insufficient documentation

## 2020-09-25 DIAGNOSIS — R319 Hematuria, unspecified: Secondary | ICD-10-CM | POA: Insufficient documentation

## 2020-09-25 HISTORY — DX: Abnormal levels of other serum enzymes: R74.8

## 2020-09-25 LAB — CBC
HCT: 43.2 % (ref 36.0–46.0)
Hemoglobin: 15.2 g/dL — ABNORMAL HIGH (ref 12.0–15.0)
MCH: 31.9 pg (ref 26.0–34.0)
MCHC: 35.2 g/dL (ref 30.0–36.0)
MCV: 90.8 fL (ref 80.0–100.0)
Platelets: 330 10*3/uL (ref 150–400)
RBC: 4.76 MIL/uL (ref 3.87–5.11)
RDW: 12.4 % (ref 11.5–15.5)
WBC: 6.6 10*3/uL (ref 4.0–10.5)
nRBC: 0 % (ref 0.0–0.2)

## 2020-09-25 LAB — COMPREHENSIVE METABOLIC PANEL
ALT: 23 U/L (ref 0–44)
AST: 24 U/L (ref 15–41)
Albumin: 4.4 g/dL (ref 3.5–5.0)
Alkaline Phosphatase: 51 U/L (ref 38–126)
Anion gap: 11 (ref 5–15)
BUN: 12 mg/dL (ref 6–20)
CO2: 24 mmol/L (ref 22–32)
Calcium: 9.4 mg/dL (ref 8.9–10.3)
Chloride: 103 mmol/L (ref 98–111)
Creatinine, Ser: 0.73 mg/dL (ref 0.44–1.00)
GFR, Estimated: >60 mL/min (ref >60)
Glucose, Bld: 99 mg/dL (ref 70–99)
Potassium: 3.5 mmol/L (ref 3.5–5.1)
Sodium: 138 mmol/L (ref 135–145)
Total Bilirubin: 0.5 mg/dL (ref 0.3–1.2)
Total Protein: 7.1 g/dL (ref 6.5–8.1)

## 2020-09-25 LAB — URINALYSIS, ROUTINE W REFLEX MICROSCOPIC
Bilirubin Urine: NEGATIVE
Glucose, UA: NEGATIVE mg/dL
Ketones, ur: 15 mg/dL — AB
Leukocytes,Ua: NEGATIVE
Nitrite: NEGATIVE
Protein, ur: NEGATIVE mg/dL
Specific Gravity, Urine: 1.01 (ref 1.005–1.030)
pH: 6.5 (ref 5.0–8.0)

## 2020-09-25 LAB — URINALYSIS, MICROSCOPIC (REFLEX)

## 2020-09-25 LAB — PREGNANCY, URINE: Preg Test, Ur: NEGATIVE

## 2020-09-25 LAB — LIPASE, BLOOD: Lipase: 46 U/L (ref 11–51)

## 2020-09-25 MED ORDER — FAMOTIDINE 20 MG PO TABS: 20.0000 mg | ORAL_TABLET | Freq: Two times a day (BID) | ORAL | 0 refills | Status: AC

## 2020-09-25 MED ORDER — ALUM & MAG HYDROXIDE-SIMETH 200-200-20 MG/5ML PO SUSP
30.0000 mL | Freq: Once | ORAL | Status: AC
  Administered 2020-09-25: 30 mL via ORAL
  Filled 2020-09-25: qty 30

## 2020-09-25 MED ORDER — KETOROLAC TROMETHAMINE 30 MG/ML IJ SOLN
30.0000 mg | Freq: Once | INTRAMUSCULAR | Status: AC
  Administered 2020-09-25: 30 mg via INTRAVENOUS
  Filled 2020-09-25: qty 1

## 2020-09-25 MED ORDER — FAMOTIDINE 20 MG PO TABS
20.0000 mg | ORAL_TABLET | Freq: Once | ORAL | Status: AC
  Administered 2020-09-25: 20 mg via ORAL
  Filled 2020-09-25: qty 1

## 2020-09-25 MED ORDER — IOHEXOL 300 MG/ML  SOLN
100.0000 mL | Freq: Once | INTRAMUSCULAR | Status: AC | PRN
  Administered 2020-09-25: 100 mL via INTRAVENOUS
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
## 2020-09-25 NOTE — ED Notes (Signed)
Patient transported to Ultrasound 

## 2020-09-25 NOTE — ED Triage Notes (Signed)
Pt c/o abd /right flank pain x 2 days-nausea-denies v/d-NAD-steady gait

## 2020-09-25 NOTE — Discharge Instructions (Addendum)
The ultrasound of your liver and gallbladder did not show any acute abnormalities or evidence of gallbladder inflammation.  The CT scan of your abdomen did not show any evidence of changes with your gallbladder or liver.  There were no acute findings explain your symptoms today.  Your work-up was very reassuring.  You will be given a prescription for Pepcid to help with your symptoms.  I recommend that you follow-up with the gastroenterology doctor.  Please call the office to schedule an appointment for follow-up.  Please return to the ER sooner if you have any new or worsening symptoms, or if you have any of the following symptoms:  Abdominal pain that does not go away.  You have a fever.  You keep throwing up (vomiting).  The pain is felt only in portions of the abdomen. Pain in the right side could possibly be appendicitis. In an adult, pain in the left lower portion of the abdomen could be colitis or diverticulitis.  You pass bloody or black tarry stools.  There is bright red blood in the stool.  The constipation stays for more than 4 days.  There is belly (abdominal) or rectal pain.  You do not seem to be getting better.  You have any questions or concerns.

## 2020-09-26 ENCOUNTER — Other Ambulatory Visit: Payer: Self-pay

## 2022-05-01 ENCOUNTER — Other Ambulatory Visit: Payer: Self-pay | Admitting: Physician Assistant

## 2022-05-01 ENCOUNTER — Ambulatory Visit (HOSPITAL_COMMUNITY)
Admission: RE | Admit: 2022-05-01 | Discharge: 2022-05-01 | Disposition: A | Discharge status: Home / Self Care | Payer: Self-pay | Source: Ambulatory Visit | Attending: Physician Assistant | Admitting: Physician Assistant

## 2022-05-01 DIAGNOSIS — M79604 Pain in right leg: Secondary | ICD-10-CM | POA: Insufficient documentation

## 2022-05-01 NOTE — Progress Notes (Signed)
Right leg pain and swelling after limited mobility from an ankle sprain.  Patient is on hormones
